# Patient Record
Sex: Female | Born: 1990 | Race: White | Hispanic: No | Marital: Single | Smoking: Current every day smoker
Health system: Southern US, Community
[De-identification: ages and names within clinical notes are randomized; demographics above are authoritative.]

## PROBLEM LIST (undated history)

## (undated) DIAGNOSIS — M549 Dorsalgia, unspecified: Secondary | ICD-10-CM

## (undated) HISTORY — DX: Dorsalgia, unspecified: M54.9

---

## 2017-01-12 ENCOUNTER — Encounter (HOSPITAL_COMMUNITY): Payer: Self-pay | Admitting: Emergency Medicine

## 2017-01-12 ENCOUNTER — Emergency Department (HOSPITAL_COMMUNITY)
Admission: EM | Admit: 2017-01-12 | Discharge: 2017-01-13 | Disposition: A | Discharge status: Home / Self Care | Payer: Self-pay | Attending: Emergency Medicine | Admitting: Emergency Medicine

## 2017-01-12 DIAGNOSIS — G8929 Other chronic pain: Secondary | ICD-10-CM

## 2017-01-12 DIAGNOSIS — M545 Low back pain: Secondary | ICD-10-CM

## 2017-01-12 DIAGNOSIS — M5431 Sciatica, right side: Secondary | ICD-10-CM | POA: Insufficient documentation

## 2017-01-12 DIAGNOSIS — M5432 Sciatica, left side: Secondary | ICD-10-CM | POA: Insufficient documentation

## 2017-01-12 DIAGNOSIS — R42 Dizziness and giddiness: Secondary | ICD-10-CM | POA: Insufficient documentation

## 2017-01-12 DIAGNOSIS — F17200 Nicotine dependence, unspecified, uncomplicated: Secondary | ICD-10-CM | POA: Insufficient documentation

## 2017-01-12 DIAGNOSIS — Z79899 Other long term (current) drug therapy: Secondary | ICD-10-CM | POA: Insufficient documentation

## 2017-01-12 LAB — CBC
HEMATOCRIT: 32.1 % — AB (ref 36.0–46.0)
Hemoglobin: 10.4 g/dL — ABNORMAL LOW (ref 12.0–15.0)
MCH: 23.4 pg — AB (ref 26.0–34.0)
MCHC: 32.4 g/dL (ref 30.0–36.0)
MCV: 72.1 fL — AB (ref 78.0–100.0)
PLATELETS: 277 10*3/uL (ref 150–400)
RBC: 4.45 MIL/uL (ref 3.87–5.11)
RDW: 16.6 % — ABNORMAL HIGH (ref 11.5–15.5)
WBC: 9.1 10*3/uL (ref 4.0–10.5)

## 2017-01-12 LAB — BASIC METABOLIC PANEL
Anion gap: 9 (ref 5–15)
BUN: 8 mg/dL (ref 6–20)
CHLORIDE: 109 mmol/L (ref 101–111)
CO2: 24 mmol/L (ref 22–32)
CREATININE: 0.59 mg/dL (ref 0.44–1.00)
Calcium: 9.4 mg/dL (ref 8.9–10.3)
GFR calc non Af Amer: >60 mL/min (ref >60)
Glucose, Bld: 127 mg/dL — ABNORMAL HIGH (ref 65–99)
POTASSIUM: 3.4 mmol/L — AB (ref 3.5–5.1)
SODIUM: 142 mmol/L (ref 135–145)

## 2017-01-12 LAB — URINALYSIS, ROUTINE W REFLEX MICROSCOPIC
BILIRUBIN URINE: NEGATIVE
Glucose, UA: NEGATIVE mg/dL
HGB URINE DIPSTICK: NEGATIVE
KETONES UR: NEGATIVE mg/dL
LEUKOCYTES UA: NEGATIVE
Nitrite: NEGATIVE
Protein, ur: NEGATIVE mg/dL
SPECIFIC GRAVITY, URINE: 1.018 (ref 1.005–1.030)
WBC UA: NONE SEEN WBC/hpf (ref 0–5)
pH: 7 (ref 5.0–8.0)

## 2017-01-12 LAB — I-STAT BETA HCG BLOOD, ED (MC, WL, AP ONLY): HCG, QUANTITATIVE: 20.7 m[IU]/mL — AB (ref <5)

## 2017-01-12 LAB — LIPASE, BLOOD: Lipase: 38 U/L (ref 11–51)

## 2017-01-12 NOTE — ED Notes (Addendum)
Pt presents to ED for assessment of 2 days of light-headedness, dizziness, "feeling hot from the inside/out", anxiety.  Denies CP, denies SOB.  Pt fidgety in chair at triage.  Pt has a hx of anxiety and depression and had several medication bottles with her that were empty.  Pt states she is new to the area and has not been able to get them filled x 1 month.  (Bottles noted:  Fluoxetine, buproprion, buspirone, gabapentin, hydroxizine).  Pt c/o lower back pain, chronic in nature with rupture discs, but acute after walking to work the past two days.  Pt also c/o right lower quadrant pain yesterday, followed by two bloody bowel movements.  None since.

## 2017-01-13 LAB — RAPID URINE DRUG SCREEN, HOSP PERFORMED
AMPHETAMINES: NOT DETECTED
BENZODIAZEPINES: POSITIVE — AB
Barbiturates: NOT DETECTED
COCAINE: NOT DETECTED
OPIATES: NOT DETECTED
TETRAHYDROCANNABINOL: POSITIVE — AB

## 2017-01-13 LAB — PREGNANCY, URINE: Preg Test, Ur: NEGATIVE

## 2017-01-13 MED ORDER — GABAPENTIN 300 MG PO CAPS: 300.0000 mg | ORAL_CAPSULE | Freq: Two times a day (BID) | ORAL | 0 refills | Status: DC

## 2017-01-13 MED ORDER — HYDROCODONE-ACETAMINOPHEN 5-325 MG PO TABS
2.0000 | ORAL_TABLET | Freq: Once | ORAL | Status: AC
  Administered 2017-01-13: 2 via ORAL
  Filled 2017-01-13: qty 2

## 2017-01-13 NOTE — Discharge Instructions (Signed)
Please follow-up with the orthopedic/back specialist.  Please contact the wellness clinic for an appointment.    Return for fever, loss of consciousness, chest pain, shortness of breath, or any other worsening symptoms.
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
## 2017-01-13 NOTE — ED Provider Notes (Signed)
[facility]-EMERGENCY DEPT Provider Note   CSN: [id number] Arrival date & time: 01/12/17  2033     History   Chief Complaint Chief Complaint  Patient presents with  . Dizziness    HPI Alicia Stanley is a 26 y.o. female.  Patient presents to the ED with a chief complaint of dizziness.  She states that she has had low back pain and dizziness for the past 2 days.  She states that she notices the symptoms most when she is walking up hills toward her place of work.  She states that she becomes dizzy and feels like her heart is racing and that she might pass out.  She denies any chest pain, SOB, or abdominal pain.  She reports some associated weakness in her legs, and states that she feels like they are jello and are going to give out on her.  She denies any bowel or bladder incontinence.  She denies any saddle anesthesia.  Denies any IV drug use.  Denies any intercourse for the past several months.   The history is provided by the patient. No language interpreter was used.    History reviewed. No pertinent past medical history.  There are no active problems to display for this patient.   History reviewed. No pertinent surgical history.  OB History    No data available       Home Medications    Prior to Admission medications   Medication Sig Start Date End Date Taking? Authorizing Provider  acetaminophen (TYLENOL) 500 MG tablet Take 1,500 mg by mouth every 6 (six) hours as needed for mild pain.   Yes [provider]  aspirin-acetaminophen-caffeine (EXCEDRIN MIGRAINE) [phone] [id number] MG tablet Take 1-2 tablets by mouth every 6 (six) hours as needed for headache.   Yes [provider]  B Complex-C (B-COMPLEX WITH VITAMIN C) tablet Take 1 tablet by mouth daily.   Yes [provider]  ibuprofen (ADVIL,MOTRIN) 100 MG/5ML suspension Take 200-800 mg by mouth every 4 (four) hours as needed for mild pain.   Yes [provider]    Family History History reviewed.  No pertinent family history.  Social History Social History  Substance Use Topics  . Smoking status: Current Every Day Smoker    Packs/day: 0.50  . Smokeless tobacco: Never Used  . Alcohol use No     Allergies   Morphine and related   Review of Systems Review of Systems  All other systems reviewed and are negative.    Physical Exam Updated Vital Signs BP (!) 150/106   Pulse 96   Temp 99 F (37.2 C) (Oral)   Resp 15   Ht 5\' 3"  (1.6 m)   Wt 52.2 kg (115 lb)   LMP 12/29/2016   SpO2 100%   BMI 20.37 kg/m   Physical Exam  Constitutional: She is oriented to person, place, and time. She appears well-developed and well-nourished.  HENT:  Head: Normocephalic and atraumatic.  Eyes: Pupils are equal, round, and reactive to light. Conjunctivae and EOM are normal.  Neck: Normal range of motion. Neck supple.  Cardiovascular: Normal rate and regular rhythm.  Exam reveals no gallop and no friction rub.   No murmur heard. Pulmonary/Chest: Effort normal and breath sounds normal. No respiratory distress. She has no wheezes. She has no rales. She exhibits no tenderness.  Abdominal: Soft. Bowel sounds are normal. She exhibits no distension and no mass. There is no tenderness. There is no rebound and no guarding.  Musculoskeletal: Normal range  of motion. She exhibits no edema or tenderness.  Mild paraspinal muscle tenderness  Neurological: She is alert and oriented to person, place, and time.  Normal sensation and strength  Skin: Skin is warm and dry.  No rash  Psychiatric: She has a normal mood and affect. Her behavior is normal. Judgment and thought content normal.  Nursing note and vitals reviewed.    ED Treatments / Results  Labs (all labs ordered are listed, but only abnormal results are displayed) Labs Reviewed  BASIC METABOLIC PANEL - Abnormal; Notable for the following:       Result Value   Potassium 3.4 (*)    Glucose, Bld 127 (*)    All other components within  normal limits  CBC - Abnormal; Notable for the following:    Hemoglobin 10.4 (*)    HCT 32.1 (*)    MCV 72.1 (*)    MCH 23.4 (*)    RDW 16.6 (*)    All other components within normal limits  URINALYSIS, ROUTINE W REFLEX MICROSCOPIC - Abnormal; Notable for the following:    APPearance CLOUDY (*)    Bacteria, UA RARE (*)    Squamous Epithelial / LPF 0-5 (*)    All other components within normal limits  I-STAT BETA HCG BLOOD, ED ([facility], [facility], [facility] ONLY) - Abnormal; Notable for the following:    I-stat hCG, quantitative 20.7 (*)    All other components within normal limits  LIPASE, BLOOD  RAPID URINE DRUG SCREEN, HOSP PERFORMED  PREGNANCY, URINE    EKG  EKG Interpretation None       Radiology No results found.  Procedures Procedures (including critical care time)  Medications Ordered in ED Medications  HYDROcodone-acetaminophen (NORCO/VICODIN) 5-325 MG per tablet 2 tablet (not administered)     Initial Impression / Assessment and Plan / ED Course  I have reviewed the triage vital signs and the nursing notes.  Pertinent labs & imaging results that were available during my care of the patient were reviewed by me and considered in my medical decision making (see chart for details).     Patient with low back pain and dizziness x 2 days.  Noted to be tachycardic and hypertensive.  She denies drug use, but will check UDS as her vitals may be reflective of withdrawal.  She has been in pain management in the past, but is no longer and is new to the area.  Also, her HCG is elevated, but she adamantly denies any sexual intercourse in the past several months.  She has no abdominal pain.  Could be false positive.  Will check urine preg and discussed with Dr. Elesa MassedWard.  Patient is in no apparent distress now.  Will treat pain and reassess.  Patient reassessed.  Feeling improved.  Urine preg is negative.  Denies any sexual contacts.  HCG thought to be false positive.  No headache or fever.  No  evidence of meningitis.  No sign of epidural abscess.  Denies IV drug use.    Discussed with Dr. Elesa MassedWard, who agrees with plan for outpatient follow-up.  Final Clinical Impressions(s) / ED Diagnoses   Final diagnoses:  Chronic bilateral low back pain, with sciatica presence unspecified  Dizziness    New Prescriptions New Prescriptions   GABAPENTIN (NEURONTIN) 300 MG CAPSULE    Take 1 capsule (300 mg total) by mouth 2 (two) times daily.     Roxy HorsemanBrowning, Cadin Luka, PA-C 01/13/17 0259    Ward, Layla MawKristen N, DO 01/13/17 [id number]

## 2017-03-21 ENCOUNTER — Encounter: Payer: Self-pay | Admitting: Emergency Medicine

## 2017-03-21 ENCOUNTER — Emergency Department
Admission: EM | Admit: 2017-03-21 | Discharge: 2017-03-21 | Disposition: A | Discharge status: Home / Self Care | Payer: Medicaid Other | Attending: Emergency Medicine | Admitting: Emergency Medicine

## 2017-03-21 DIAGNOSIS — Z7982 Long term (current) use of aspirin: Secondary | ICD-10-CM | POA: Diagnosis not present

## 2017-03-21 DIAGNOSIS — F17200 Nicotine dependence, unspecified, uncomplicated: Secondary | ICD-10-CM | POA: Diagnosis not present

## 2017-03-21 DIAGNOSIS — L02414 Cutaneous abscess of left upper limb: Secondary | ICD-10-CM | POA: Insufficient documentation

## 2017-03-21 DIAGNOSIS — L0291 Cutaneous abscess, unspecified: Secondary | ICD-10-CM

## 2017-03-21 DIAGNOSIS — R2232 Localized swelling, mass and lump, left upper limb: Secondary | ICD-10-CM | POA: Diagnosis present

## 2017-03-21 DIAGNOSIS — Z79899 Other long term (current) drug therapy: Secondary | ICD-10-CM | POA: Diagnosis not present

## 2017-03-21 MED ORDER — SULFAMETHOXAZOLE-TRIMETHOPRIM 800-160 MG PO TABS: 1.0000 | ORAL_TABLET | Freq: Two times a day (BID) | ORAL | 0 refills | Status: DC

## 2017-03-21 MED ORDER — SULFAMETHOXAZOLE-TRIMETHOPRIM 800-160 MG PO TABS
1.0000 | ORAL_TABLET | Freq: Once | ORAL | Status: AC
  Administered 2017-03-21: 1 via ORAL
  Filled 2017-03-21: qty 1

## 2017-03-21 MED ORDER — TRAMADOL HCL 50 MG PO TABS
50.0000 mg | ORAL_TABLET | Freq: Once | ORAL | Status: AC
  Administered 2017-03-21: 50 mg via ORAL
  Filled 2017-03-21: qty 1

## 2017-03-21 MED ORDER — TRAMADOL HCL 50 MG PO TABS: 50.0000 mg | ORAL_TABLET | Freq: Once | ORAL | Status: DC

## 2017-03-21 MED ORDER — LIDOCAINE-EPINEPHRINE 2 %-1:100000 IJ SOLN
20.0000 mL | Freq: Once | INTRAMUSCULAR | Status: AC
  Administered 2017-03-21: 20 mL via INTRADERMAL
  Filled 2017-03-21: qty 20.4

## 2017-03-21 MED ORDER — CEPHALEXIN 500 MG PO CAPS
500.0000 mg | ORAL_CAPSULE | Freq: Once | ORAL | Status: AC
  Administered 2017-03-21: 500 mg via ORAL
  Filled 2017-03-21: qty 1

## 2017-03-21 MED ORDER — TRAMADOL HCL 50 MG PO TABS: 50.0000 mg | ORAL_TABLET | Freq: Four times a day (QID) | ORAL | 0 refills | Status: DC | PRN

## 2017-03-21 MED ORDER — CEPHALEXIN 500 MG PO CAPS: 500.0000 mg | ORAL_CAPSULE | Freq: Two times a day (BID) | ORAL | 0 refills | Status: DC

## 2017-03-21 NOTE — ED Triage Notes (Signed)
Pt states that for about 5-6 days she has noticed an area that looks to be abscessed to her left forearm, pt states that she has bumped it a couple times and has made it worse, pt has redness and swelling up the left forearm.

## 2017-03-21 NOTE — ED Provider Notes (Signed)
[facility] Emergency Department Provider Note   ____________________________________________    I have reviewed the triage vital signs and the nursing notes.   HISTORY  Chief Complaint Abscess     HPI Grabiela Stanley is a 26 y.o. female Presents with complaints of painful swelling to her left forearm. Patient reports worsening pain to the area, she reports she keeps hitting it on things which she thinks is causing it to swell further. She reports it is red and inflamed. Denies injections or injury to the area. no fevers or chills   No past medical history on file.  There are no active problems to display for this patient.   No past surgical history on file.  Prior to Admission medications   Medication Sig Start Date End Date Taking? Authorizing Provider  acetaminophen (TYLENOL) 500 MG tablet Take 1,500 mg by mouth every 6 (six) hours as needed for mild pain.    [provider]  aspirin-acetaminophen-caffeine (EXCEDRIN MIGRAINE) [phone] MG tablet Take 1-2 tablets by mouth every 6 (six) hours as needed for headache.    [provider]  B Complex-C (B-COMPLEX WITH VITAMIN C) tablet Take 1 tablet by mouth daily.    [provider]  cephALEXin (KEFLEX) 500 MG capsule Take 1 capsule (500 mg total) by mouth 2 (two) times daily. 03/21/17   Jene Every, MD  gabapentin (NEURONTIN) 300 MG capsule Take 1 capsule (300 mg total) by mouth 2 (two) times daily. 01/13/17   Roxy Horseman, PA-C  ibuprofen (ADVIL,MOTRIN) 100 MG/5ML suspension Take 200-800 mg by mouth every 4 (four) hours as needed for mild pain.    [provider]  sulfamethoxazole-trimethoprim (BACTRIM DS,SEPTRA DS) 800-160 MG tablet Take 1 tablet by mouth 2 (two) times daily. 03/21/17   Jene Every, MD  traMADol (ULTRAM) 50 MG tablet Take 1 tablet (50 mg total) by mouth every 6 (six) hours as needed. 03/21/17 03/21/18  Jene Every, MD      Allergies Morphine and related  No family history on file.  Social History Social History  Substance Use Topics  . Smoking status: Current Every Day Smoker    Packs/day: 0.50  . Smokeless tobacco: Never Used  . Alcohol use No    Review of Systems  Constitutional: No fever/chills     Gastrointestinal: No abdominal pain.  No nausea, no vomiting.    Musculoskeletal: arm pain Skin: as above Neurological: Negative for numbnesss     ____________________________________________   PHYSICAL EXAM:  VITAL SIGNS: ED Triage Vitals [03/21/17 2120]  Enc Vitals Group     BP (!) 143/89     Pulse Rate 79     Resp 18     Temp 99.1 F (37.3 C)     Temp Source Oral     SpO2 100 %     Weight 52.2 kg (115 lb)     Height 1.6 m ( )     Head Circumference      Peak Flow      Pain Score 9     Pain Loc      Pain Edu?      Excl. in GC?     Constitutional: Alert and oriented. No acute distress. Pleasant and interactive Eyes: Conjunctivae are normal.   Nose: No congestion/rhinnorhea. Mouth/Throat: Mucous membranes are moist.   Cardiovascular: Normal rate, regular rhythm.  Respiratory: Normal respiratory effort.  No retractions. Genitourinary: deferred Musculoskeletal: no joint swelling Neurologic:  Normal speech and language. No  gross focal neurologic deficits are appreciated.   Skin:  Skin is warm, dry and intact. large likely abscess distal left forearm at the base of the scar. Fluctuant and very tender, erythema, no spreading erythema   ____________________________________________   LABS (all labs ordered are listed, but only abnormal results are displayed)  Labs Reviewed - No data to display ____________________________________________  EKG   ____________________________________________  RADIOLOGY  None ____________________________________________   PROCEDURES  Procedure(s) performed: yes  INCISION AND DRAINAGE Performed by: Jene Every Consent: Verbal consent obtained. Risks and benefits: risks, benefits and alternatives were discussed Type: abscess  Body area: left forearm  Anesthesia: local infiltration  Incision was made with a scalpel.  Local anesthetic: lidocaine 1% w epinephrine  Anesthetic total: 3 ml  Complexity: complex Blunt dissection to break up loculations  Drainage: purulent  Drainage amount: large  Packing material: 1/4 in iodoform gauze  Patient tolerance: Patient tolerated the procedure well with no immediate complications.       Critical Care performed: No ____________________________________________   INITIAL IMPRESSION / ASSESSMENT AND PLAN / ED COURSE  Pertinent labs & imaging results that were available during my care of the patient were reviewed by me and considered in my medical decision making (see chart for details).  patient with large abscess to the left forearm, unclear cause.copious amount of purulent Fluid drained from abscess we'll place the patient on Bactrim and Keflex and have her back in 2 days for repeat check   ____________________________________________   FINAL CLINICAL IMPRESSION(S) / ED DIAGNOSES  Final diagnoses:  Abscess      NEW MEDICATIONS STARTED DURING THIS VISIT:  New Prescriptions   CEPHALEXIN (KEFLEX) 500 MG CAPSULE    Take 1 capsule (500 mg total) by mouth 2 (two) times daily.   SULFAMETHOXAZOLE-TRIMETHOPRIM (BACTRIM DS,SEPTRA DS) 800-160 MG TABLET    Take 1 tablet by mouth 2 (two) times daily.   TRAMADOL (ULTRAM) 50 MG TABLET    Take 1 tablet (50 mg total) by mouth every 6 (six) hours as needed.     Note:  This document was prepared using Dragon voice recognition software and may include unintentional dictation errors.    Jene Every, MD 03/21/17 2237

## 2017-04-14 ENCOUNTER — Emergency Department
Admission: EM | Admit: 2017-04-14 | Discharge: 2017-04-14 | Disposition: A | Discharge status: Home / Self Care | Payer: Medicaid Other | Attending: Emergency Medicine | Admitting: Emergency Medicine

## 2017-04-14 ENCOUNTER — Emergency Department: Payer: Medicaid Other

## 2017-04-14 ENCOUNTER — Encounter: Payer: Self-pay | Admitting: Emergency Medicine

## 2017-04-14 DIAGNOSIS — W51XXXA Accidental striking against or bumped into by another person, initial encounter: Secondary | ICD-10-CM | POA: Insufficient documentation

## 2017-04-14 DIAGNOSIS — Y929 Unspecified place or not applicable: Secondary | ICD-10-CM | POA: Insufficient documentation

## 2017-04-14 DIAGNOSIS — S2232XA Fracture of one rib, left side, initial encounter for closed fracture: Secondary | ICD-10-CM

## 2017-04-14 DIAGNOSIS — S29001A Unspecified injury of muscle and tendon of front wall of thorax, initial encounter: Secondary | ICD-10-CM | POA: Diagnosis present

## 2017-04-14 DIAGNOSIS — Y9383 Activity, rough housing and horseplay: Secondary | ICD-10-CM | POA: Insufficient documentation

## 2017-04-14 DIAGNOSIS — F17200 Nicotine dependence, unspecified, uncomplicated: Secondary | ICD-10-CM | POA: Insufficient documentation

## 2017-04-14 DIAGNOSIS — Y999 Unspecified external cause status: Secondary | ICD-10-CM | POA: Insufficient documentation

## 2017-04-14 LAB — POCT PREGNANCY, URINE: Preg Test, Ur: NEGATIVE

## 2017-04-14 MED ORDER — OXYCODONE-ACETAMINOPHEN 5-325 MG PO TABS: 1.0000 | ORAL_TABLET | Freq: Four times a day (QID) | ORAL | 0 refills | Status: DC | PRN

## 2017-04-14 MED ORDER — OXYCODONE-ACETAMINOPHEN 5-325 MG PO TABS
1.0000 | ORAL_TABLET | ORAL | Status: AC
  Administered 2017-04-14: 1 via ORAL
  Filled 2017-04-14: qty 1

## 2017-04-14 MED ORDER — IBUPROFEN 400 MG PO TABS
400.0000 mg | ORAL_TABLET | ORAL | Status: AC
  Administered 2017-04-14: 400 mg via ORAL
  Filled 2017-04-14: qty 1

## 2017-04-14 MED ORDER — LIDOCAINE 5 % EX PTCH
1.0000 | MEDICATED_PATCH | CUTANEOUS | Status: DC
  Administered 2017-04-14: 1 via TRANSDERMAL
  Filled 2017-04-14: qty 1
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
## 2017-04-26 ENCOUNTER — Encounter (HOSPITAL_COMMUNITY): Payer: Self-pay | Admitting: Emergency Medicine

## 2017-04-26 ENCOUNTER — Ambulatory Visit (HOSPITAL_COMMUNITY)
Admission: EM | Admit: 2017-04-26 | Discharge: 2017-04-26 | Disposition: A | Discharge status: Home / Self Care | Payer: No Typology Code available for payment source | Source: Ambulatory Visit | Attending: Emergency Medicine | Admitting: Emergency Medicine

## 2017-04-26 ENCOUNTER — Emergency Department (HOSPITAL_COMMUNITY)
Admission: EM | Admit: 2017-04-26 | Discharge: 2017-04-26 | Disposition: A | Discharge status: Home / Self Care | Payer: Medicaid Other | Attending: Emergency Medicine | Admitting: Emergency Medicine

## 2017-04-26 ENCOUNTER — Other Ambulatory Visit: Payer: Self-pay

## 2017-04-26 DIAGNOSIS — Z0441 Encounter for examination and observation following alleged adult rape: Secondary | ICD-10-CM | POA: Insufficient documentation

## 2017-04-26 LAB — POC URINE PREG, ED: Preg Test, Ur: NEGATIVE

## 2017-04-26 MED ORDER — PROMETHAZINE HCL 25 MG PO TABS
25.0000 mg | ORAL_TABLET | Freq: Four times a day (QID) | ORAL | Status: DC | PRN
  Administered 2017-04-26: 25 mg via ORAL

## 2017-04-26 MED ORDER — METRONIDAZOLE 500 MG PO TABS
2000.0000 mg | ORAL_TABLET | Freq: Once | ORAL | Status: AC
  Administered 2017-04-26: 2000 mg via ORAL

## 2017-04-26 MED ORDER — ULIPRISTAL ACETATE 30 MG PO TABS
30.0000 mg | ORAL_TABLET | Freq: Once | ORAL | Status: AC
  Administered 2017-04-26: 30 mg via ORAL
  Filled 2017-04-26: qty 1

## 2017-04-26 MED ORDER — CEFTRIAXONE SODIUM 250 MG IJ SOLR
250.0000 mg | Freq: Once | INTRAMUSCULAR | Status: AC
  Administered 2017-04-26: 250 mg via INTRAMUSCULAR

## 2017-04-26 MED ORDER — OXYCODONE-ACETAMINOPHEN 5-325 MG PO TABS: 1.0000 | ORAL_TABLET | ORAL | 0 refills | Status: AC | PRN

## 2017-04-26 MED ORDER — LIDOCAINE HCL (PF) 1 % IJ SOLN
0.9000 mL | Freq: Once | INTRAMUSCULAR | Status: AC
  Administered 2017-04-26: 0.9 mL

## 2017-04-26 MED ORDER — HYDROCODONE-ACETAMINOPHEN 5-325 MG PO TABS
2.0000 | ORAL_TABLET | Freq: Once | ORAL | Status: AC
  Administered 2017-04-26: 2 via ORAL
  Filled 2017-04-26: qty 2

## 2017-04-26 MED ORDER — AZITHROMYCIN 250 MG PO TABS
1000.0000 mg | ORAL_TABLET | Freq: Once | ORAL | Status: AC
  Administered 2017-04-26: 1000 mg via ORAL
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
## 2017-04-26 NOTE — ED Notes (Signed)
Pt left with SANE nurse to exam room, she took urine sample with her for testing

## 2017-04-26 NOTE — ED Notes (Signed)
SANE nurse at bedside.

## 2017-04-26 NOTE — SANE Note (Signed)
Follow-up Phone Call  Patient gives verbal consent for a FNE/SANE follow-up phone call in 48-72 hours: yes Patient's telephone number: [phone] Patient gives verbal consent to leave voicemail at the phone number listed above: yes DO NOT CALL between the hours of: call at 2pm

## 2017-04-26 NOTE — SANE Note (Signed)
Received call from triage, advising of pt.  Advised to call back when pt had been medically cleared - they agree.

## 2017-04-26 NOTE — ED Notes (Signed)
Pt ambulatory to room c/o waiting 3 hrs
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
## 2017-04-26 NOTE — SANE Note (Signed)
Spoke to charge RN in ED to question how long before pt is roomed.  She advises that she will try and pt in a room within an hour.
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
## 2017-04-26 NOTE — ED Notes (Signed)
SANE RN notified of patient's presence.  States to call back when patient is medically cleared
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
## 2017-04-26 NOTE — Discharge Instructions (Signed)
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 for gastrointestinal problems -epinephrine -medicines for allergies or colds -muscle relaxants -narcotic medicines for pain -other medicines that  prolong the QT interval (cause an abnormal heart rhythm) -tramadol -trimethobenzamide This list may not describe all possible interactions. Give your health care provider a list of all the medicines, herbs, non-prescription drugs, or dietary supplements you use. Also tell them if you smoke, drink alcohol, or use illegal drugs. Some items may interact with your medicine. What should I watch for while using this medicine? Tell your doctor or health care professional if your symptoms do not start to get better in 1 to 2 days. You may get drowsy or dizzy. Do not drive, use machinery, or do anything that needs mental alertness until you know how this medicine affects you. To reduce the risk of dizzy or fainting spells,  do not stand or sit up quickly, especially if you are an older patient. Alcohol may increase dizziness and drowsiness. Avoid alcoholic drinks. Your mouth may get dry. Chewing sugarless gum or sucking hard candy, and drinking plenty of water may help. Contact your doctor if the problem does not go away or is severe. This medicine may cause dry eyes and blurred vision. If you wear contact lenses you may feel some discomfort. Lubricating drops may help. See your eye doctor if the problem does not go away or is severe. This medicine can make you more sensitive to the sun. Keep out of the sun. If you cannot avoid being in the sun, wear protective clothing and use sunscreen. Do not use sun lamps or tanning beds/booths. If you are diabetic, check your blood-sugar levels regularly. What side effects may I notice from receiving this medicine? Side effects that you should report to your doctor or health care professional as soon as possible: -blurred vision -irregular heartbeat, palpitations or chest pain -muscle or facial twitches -pain or difficulty passing urine -seizures -skin rash -slowed or shallow breathing -unusual bleeding or bruising -yellowing of the eyes or skin Side effects that usually do not require medical attention (report to your doctor or health care professional if they continue or are bothersome): -headache -nightmares, agitation, nervousness, excitability, not able to sleep (these are more likely in children) -stuffy nose This list may not describe all possible side effects. Call your doctor for medical advice about side effects. You may report side effects to FDA at 1-800-FDA-1088. Where should I keep my medicine? Keep out of the reach of children. Store at room temperature, between 20 and 25 degrees C (68 and 77 degrees F). Protect from light. Throw away any unused medicine after the expiration date. NOTE: This sheet is a summary. It may not cover all possible information. If  you have questions about this medicine, talk to your doctor, pharmacist, or health care provider.  2017 Elsevier/Gold Standard (2013-01-30 15:04:46)   Ceftriaxone (Injection/Shot) Also known as:  Rocephin  Ceftriaxone injection What is this medicine? CEFTRIAXONE (sef try AX one) is a cephalosporin antibiotic. It is used to treat certain kinds of bacterial infections. It will not work for colds, flu, or other viral infections. This medicine may be used for other purposes; ask your health care provider or pharmacist if you have questions. COMMON BRAND NAME(S): Rocephin What should I tell my health care provider before I take this medicine? They need to know if you have any of these conditions: -any chronic illness -bowel disease, like colitis -both kidney and liver disease -high bilirubin level in newborn patients -an unusual or allergic reaction to ceftriaxone, other cephalosporin or penicillin antibiotics, foods, dyes, or preservatives -pregnant or trying to get pregnant -breast-feeding How  should I use this medicine? This medicine is injected into a muscle or infused it into a vein. It is usually given in a medical office or clinic. If you are to give this medicine you will be taught how to inject it. Follow instructions carefully. Use your doses at regular intervals. Do not take your medicine more often than directed. Do not skip doses or stop your medicine early even if you feel better. Do not stop taking except on your doctor's advice. Talk to your pediatrician regarding the use of this medicine in children. Special care may be needed. Overdosage: If you think you have taken too much of this medicine contact a poison control center or emergency room at once. NOTE: This medicine is only for you. Do not share this medicine with others. What if I miss a dose? If you miss a dose, take it as soon as you can. If it is almost time for your next dose, take only that dose. Do not take double  or extra doses. What may interact with this medicine? Do not take this medicine with any of the following medications: -intravenous calcium This medicine may also interact with the following medications: -birth control pills This list may not describe all possible interactions. Give your health care provider a list of all the medicines, herbs, non-prescription drugs, or dietary supplements you use. Also tell them if you smoke, drink alcohol, or use illegal drugs. Some items may interact with your medicine. What should I watch for while using this medicine? Tell your doctor or health care professional if your symptoms do not improve or if they get worse. Do not treat diarrhea with over the counter products. Contact your doctor if you have diarrhea that lasts more than 2 days or if it is severe and watery. If you are being treated for a sexually transmitted disease, avoid sexual contact until you have finished your treatment. Having sex can infect your sexual partner. Calcium may bind to this medicine and cause lung or kidney problems. Avoid calcium products while taking this medicine and for 48 hours after taking the last dose of this medicine. What side effects may I notice from receiving this medicine? Side effects that you should report to your doctor or health care professional as soon as possible: -allergic reactions like skin rash, itching or hives, swelling of the face, lips, or tongue -breathing problems -fever, chills -irregular heartbeat -pain when passing urine -seizures -stomach pain, cramps -unusual bleeding, bruising -unusually weak or tired Side effects that usually do not require medical attention (report to your doctor or health care professional if they continue or are bothersome): -diarrhea -dizzy, drowsy -headache -nausea, vomiting -pain, swelling, irritation where injected -stomach upset -sweating This list may not describe all possible side effects. Call your  doctor for medical advice about side effects. You may report side effects to FDA at 1-800-FDA-1088. Where should I keep my medicine? Keep out of the reach of children. Store at room temperature below 25 degrees C (77 degrees F). Protect from light. Throw away any unused vials after the expiration date. NOTE: This sheet is a summary. It may not cover all possible information. If you have questions about this medicine, talk to your doctor, pharmacist, or health care provider.  2017 Elsevier/Gold Standard (2013-12-17 09:14:54)

## 2017-04-27 LAB — POC URINE PREG, ED: Preg Test, Ur: NEGATIVE

## 2017-05-11 ENCOUNTER — Encounter: Payer: Self-pay | Admitting: Nurse Practitioner
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
## 2018-12-20 DIAGNOSIS — Z720 Tobacco use: Secondary | ICD-10-CM

## 2018-12-20 DIAGNOSIS — E876 Hypokalemia: Secondary | ICD-10-CM

## 2018-12-20 DIAGNOSIS — F3181 Bipolar II disorder: Secondary | ICD-10-CM

## 2018-12-20 DIAGNOSIS — E871 Hypo-osmolality and hyponatremia: Secondary | ICD-10-CM

## 2018-12-20 DIAGNOSIS — N2881 Hypertrophy of kidney: Secondary | ICD-10-CM

## 2018-12-20 DIAGNOSIS — N10 Acute tubulo-interstitial nephritis: Secondary | ICD-10-CM

## 2018-12-20 DIAGNOSIS — A419 Sepsis, unspecified organism: Secondary | ICD-10-CM

## 2018-12-20 DIAGNOSIS — R740 Nonspecific elevation of levels of transaminase and lactic acid dehydrogenase [LDH]: Secondary | ICD-10-CM

## 2018-12-20 DIAGNOSIS — D62 Acute posthemorrhagic anemia: Secondary | ICD-10-CM

## 2018-12-20 DIAGNOSIS — K828 Other specified diseases of gallbladder: Secondary | ICD-10-CM

## 2018-12-20 DIAGNOSIS — R161 Splenomegaly, not elsewhere classified: Secondary | ICD-10-CM

## 2022-04-13 ENCOUNTER — Emergency Department (HOSPITAL_COMMUNITY)
Admission: EM | Admit: 2022-04-13 | Discharge: 2022-04-14 | Disposition: A | Discharge status: Home / Self Care | Payer: Commercial Managed Care - HMO | Attending: Emergency Medicine | Admitting: Emergency Medicine

## 2022-04-13 ENCOUNTER — Other Ambulatory Visit: Payer: Self-pay

## 2022-04-13 ENCOUNTER — Encounter (HOSPITAL_COMMUNITY): Payer: Self-pay

## 2022-04-13 DIAGNOSIS — Y900 Blood alcohol level of less than 20 mg/100 ml: Secondary | ICD-10-CM | POA: Insufficient documentation

## 2022-04-13 DIAGNOSIS — F17200 Nicotine dependence, unspecified, uncomplicated: Secondary | ICD-10-CM | POA: Diagnosis not present

## 2022-04-13 DIAGNOSIS — D72821 Monocytosis (symptomatic): Secondary | ICD-10-CM | POA: Diagnosis not present

## 2022-04-13 DIAGNOSIS — R0602 Shortness of breath: Secondary | ICD-10-CM | POA: Diagnosis not present

## 2022-04-13 DIAGNOSIS — R059 Cough, unspecified: Secondary | ICD-10-CM | POA: Diagnosis not present

## 2022-04-13 DIAGNOSIS — F1510 Other stimulant abuse, uncomplicated: Secondary | ICD-10-CM | POA: Insufficient documentation

## 2022-04-13 DIAGNOSIS — R079 Chest pain, unspecified: Secondary | ICD-10-CM | POA: Insufficient documentation

## 2022-04-13 DIAGNOSIS — M549 Dorsalgia, unspecified: Secondary | ICD-10-CM | POA: Insufficient documentation

## 2022-04-13 HISTORY — DX: Dorsalgia, unspecified: M54.9

## 2022-04-13 NOTE — ED Triage Notes (Addendum)
Pt BIB EMS with reports of back pain/spasms. (Left upper, middle back). Pt smoked weed earlier. Pt is currently sleeping.

## 2022-04-13 NOTE — ED Provider Triage Note (Signed)
Emergency Medicine Provider Triage Evaluation Note  Brianny Soulliere , a 31 y.o. female  was evaluated in triage.  Pt complains of drug abuse. Endorses smoking methamphetamine earlier tonight, then becoming jittery and short of breath. Symptoms have primarily resolved. History of chronic back pain.  Review of Systems  Positive: Back pain, drug use Negative: Lower extremity weakness, paresthesias, urinary or fecal incontinence  Physical Exam  BP (!) 135/109   Pulse 87   Temp 97.9 F (36.6 C) (Oral)   Resp 16   SpO2 100%  Gen:   Asleep, easily arousable Resp:  Normal effort  MSK:   Moves extremities without difficulty  Other:    Medical Decision Making  Medically screening exam initiated at 11:24 PM.  Appropriate orders placed.  Doyne Ellinger was informed that the remainder of the evaluation will be completed by another provider, this initial triage assessment does not replace that evaluation, and the importance of remaining in the ED until their evaluation is complete.     Etta Quill, NP 04/13/22 [id number]

## 2022-04-14 ENCOUNTER — Emergency Department (HOSPITAL_COMMUNITY): Payer: Commercial Managed Care - HMO

## 2022-04-14 LAB — CBC WITH DIFFERENTIAL/PLATELET
Abs Immature Granulocytes: 0.05 10*3/uL (ref 0.00–0.07)
Basophils Absolute: 0.1 10*3/uL (ref 0.0–0.1)
Basophils Relative: 1 %
Eosinophils Absolute: 0.2 10*3/uL (ref 0.0–0.5)
Eosinophils Relative: 2 %
HCT: 38.3 % (ref 36.0–46.0)
Hemoglobin: 13 g/dL (ref 12.0–15.0)
Immature Granulocytes: 1 %
Lymphocytes Relative: 22 %
Lymphs Abs: 2.4 10*3/uL (ref 0.7–4.0)
MCH: 28.8 pg (ref 26.0–34.0)
MCHC: 33.9 g/dL (ref 30.0–36.0)
MCV: 84.9 fL (ref 80.0–100.0)
Monocytes Absolute: 0.7 10*3/uL (ref 0.1–1.0)
Monocytes Relative: 7 %
Neutro Abs: 7.7 10*3/uL (ref 1.7–7.7)
Neutrophils Relative %: 67 %
Platelets: 257 10*3/uL (ref 150–400)
RBC: 4.51 MIL/uL (ref 3.87–5.11)
RDW: 11.9 % (ref 11.5–15.5)
WBC: 11.1 10*3/uL — ABNORMAL HIGH (ref 4.0–10.5)
nRBC: 0 % (ref 0.0–0.2)

## 2022-04-14 LAB — COMPREHENSIVE METABOLIC PANEL
ALT: 19 U/L (ref 0–44)
AST: 16 U/L (ref 15–41)
Albumin: 4.3 g/dL (ref 3.5–5.0)
Alkaline Phosphatase: 55 U/L (ref 38–126)
Anion gap: 7 (ref 5–15)
BUN: 17 mg/dL (ref 6–20)
CO2: 25 mmol/L (ref 22–32)
Calcium: 9.1 mg/dL (ref 8.9–10.3)
Chloride: 106 mmol/L (ref 98–111)
Creatinine, Ser: 0.71 mg/dL (ref 0.44–1.00)
GFR, Estimated: >60 mL/min (ref >60)
Glucose, Bld: 104 mg/dL — ABNORMAL HIGH (ref 70–99)
Potassium: 3.6 mmol/L (ref 3.5–5.1)
Sodium: 138 mmol/L (ref 135–145)
Total Bilirubin: 0.8 mg/dL (ref 0.3–1.2)
Total Protein: 6.9 g/dL (ref 6.5–8.1)

## 2022-04-14 LAB — RAPID URINE DRUG SCREEN, HOSP PERFORMED
Amphetamines: POSITIVE — AB
Barbiturates: NOT DETECTED
Benzodiazepines: NOT DETECTED
Cocaine: NOT DETECTED
Opiates: NOT DETECTED
Tetrahydrocannabinol: NOT DETECTED

## 2022-04-14 LAB — I-STAT BETA HCG BLOOD, ED (MC, WL, AP ONLY): I-stat hCG, quantitative: <5 m[IU]/mL (ref <5)

## 2022-04-14 LAB — TROPONIN I (HIGH SENSITIVITY)
Troponin I (High Sensitivity): 3 ng/L (ref <18)
Troponin I (High Sensitivity): <2 ng/L (ref <18)

## 2022-04-14 LAB — SALICYLATE LEVEL: Salicylate Lvl: <7 mg/dL — ABNORMAL LOW (ref 7.0–30.0)

## 2022-04-14 LAB — ACETAMINOPHEN LEVEL: Acetaminophen (Tylenol), Serum: <10 ug/mL — ABNORMAL LOW (ref 10–30)

## 2022-04-14 LAB — CBG MONITORING, ED: Glucose-Capillary: 84 mg/dL (ref 70–99)

## 2022-04-14 LAB — ETHANOL: Alcohol, Ethyl (B): <10 mg/dL (ref <10)

## 2022-04-14 NOTE — ED Provider Notes (Signed)
[facility] DEPT Provider Note   CSN: [id number] Arrival date & time: 04/13/22  2312     History  Chief Complaint  Patient presents with   Shortness of Breath    Alicia Stanley is a 31 y.o. female with a hx of tobacco use who presents to the ED with complaints of shortness of breath that began shortly prior to arrival which is now fairly resolved.  Patient reports that she smoked some meth and subsequently became short of breath.  At this point symptoms are fairly resolved.  She has had some mild chest pain for the past few days as well as dry cough, no chest pain at present.  Mentioned some chronic back pain to triage, no acute change.  Denies fever, chills, nausea, vomiting, leg pain/swelling, or hemoptysis. Denies SI/HI.   HPI     Home Medications Prior to Admission medications   Medication Sig Start Date End Date Taking? Authorizing Provider  oxyCODONE-acetaminophen (PERCOCET/ROXICET) 5-325 MG tablet Take 1-2 tablets every 4 (four) hours as needed by mouth for severe pain. 04/26/17   Fransico Meadow, PA-C      Allergies    Morphine and related    Review of Systems   Review of Systems  Constitutional:  Negative for chills and fever.  Respiratory:  Positive for cough and shortness of breath.   Cardiovascular:  Positive for chest pain. Negative for leg swelling.  Gastrointestinal:  Negative for abdominal pain and vomiting.  Musculoskeletal:  Positive for back pain (Chronic unchanged).  Neurological:  Negative for weakness and numbness.       Negative for incontinence or saddle anesthesia.  All other systems reviewed and are negative.   Physical Exam Updated Vital Signs BP (!) 135/109   Pulse 87   Temp 97.9 F (36.6 C) (Oral)   Resp 16   SpO2 100%  Physical Exam Vitals and nursing note reviewed.  Constitutional:      General: She is not in acute distress.    Appearance: She is well-developed. She is not toxic-appearing.  HENT:      Head: Normocephalic and atraumatic.  Eyes:     General:        Right eye: No discharge.        Left eye: No discharge.     Conjunctiva/sclera: Conjunctivae normal.  Cardiovascular:     Rate and Rhythm: Normal rate and regular rhythm.     Comments: 2+ symmetric radial pulses. Pulmonary:     Effort: No respiratory distress.     Breath sounds: Normal breath sounds. No wheezing, rhonchi or rales.  Abdominal:     General: There is no distension.     Palpations: Abdomen is soft.     Tenderness: There is no abdominal tenderness. There is no guarding or rebound.  Musculoskeletal:     Cervical back: Neck supple.     Comments: Back: No point/focal vertebral tenderness.  Skin:    General: Skin is warm and dry.  Neurological:     Mental Status: She is alert.     Comments: Sensation and strength grossly tact bilateral lower extremities.  Patient is ambulatory.  Clear speech.   Psychiatric:        Behavior: Behavior normal.     ED Results / Procedures / Treatments   Labs (all labs ordered are listed, but only abnormal results are displayed) Labs Reviewed  COMPREHENSIVE METABOLIC PANEL - Abnormal; Notable for the following components:      Result  Value   Glucose, Bld 104 (*)    All other components within normal limits  SALICYLATE LEVEL - Abnormal; Notable for the following components:   Salicylate Lvl <7.0 (*)    All other components within normal limits  ACETAMINOPHEN LEVEL - Abnormal; Notable for the following components:   Acetaminophen (Tylenol), Serum <10 (*)    All other components within normal limits  RAPID URINE DRUG SCREEN, HOSP PERFORMED - Abnormal; Notable for the following components:   Amphetamines POSITIVE (*)    All other components within normal limits  CBC WITH DIFFERENTIAL/PLATELET - Abnormal; Notable for the following components:   WBC 11.1 (*)    All other components within normal limits  ETHANOL  CBG MONITORING, ED  I-STAT BETA HCG BLOOD, ED ([facility], [facility], [facility]  ONLY)  TROPONIN I (HIGH SENSITIVITY)    EKG None  Radiology DG Chest 2 View  Result Date: 04/14/2022 CLINICAL DATA:  Dyspnea, back pain EXAM: CHEST - 2 VIEW COMPARISON:  None Available. FINDINGS: The heart size and mediastinal contours are within normal limits. Both lungs are clear. The visualized skeletal structures are unremarkable. IMPRESSION: No active cardiopulmonary disease. Electronically Signed   By: Helyn Numbers M.D.   On: 04/14/2022 02:05    Procedures Procedures    Medications Ordered in ED Medications - No data to display  ED Course/ Medical Decision Making/ A&P                           Medical Decision Making Amount and/or Complexity of Data Reviewed Radiology: ordered.  Patient presents to the ED with complaints of dyspnea, this involves an extensive number of treatment options, and is a complaint that carries with it a high risk of complications and morbidity. Nontoxic, vitals w/ mildly elevated diastolic BP.   Additional history obtained:  Chart/nursing notes reviewed.   EKG: No STEMI  Lab Tests:  I viewed & interpreted labs including:  CBC: Mildly cytosis felt to be nonspecific CMP: Grossly unremarkable Ethanol/acetaminophen/salicylate levels: Unremarkable Pregnancy test: Negative Troponin: Negative and flat UDS: Positive for amphetamines.  Imaging Studies:  I ordered and viewed the following imaging, agree with radiologist impression:  CXR; No active cardiopulmonary disease  ED Course:  Chest x-ray without pneumonia, pneumothorax, or fluid overload.  EKG without acute ischemia and troponins are flat therefore doubt ACS.  Patient is low risk Wells, doubt PE.  No widened mediastinum on imaging, symmetric pulses, doubt dissection.  No critical anemia or critical electrolyte derangement.  Dyspnea began after smoking meth, this may have triggered symptoms. Chest pain free tonight.  However patient symptoms have resolved and she has been symptom-free  while in the ED.  With reassuring work-up patient feeling improved feel she is reasonable for discharge. I discussed results, treatment plan, need for follow-up, and return precautions with the patient. Provided opportunity for questions, patient confirmed understanding and is in agreement with plan.   Social determinants: Meth use  Portions of this note were generated with Scientist, clinical (histocompatibility and immunogenetics). Dictation errors may occur despite best attempts at proofreading.   Final Clinical Impression(s) / ED Diagnoses Final diagnoses:  Shortness of breath    Rx / DC Orders ED Discharge Orders     None         Desmond Lope 04/14/22 [id number]    Tilden Fossa, MD 04/14/22 2016

## 2022-04-14 NOTE — Discharge Instructions (Signed)
You were seen in the emergency department today for shortness of breath.  Your lab work, chest x-ray, and EKG were overall reassuring.  Please avoid smoking.  Please get plenty of rest today.  Follow-up with primary care within 3 days.  Return to the emergency department for any new or worsening symptoms or any other concerns.

## 2023-03-23 DIAGNOSIS — R9431 Abnormal electrocardiogram [ECG] [EKG]: Secondary | ICD-10-CM | POA: Diagnosis not present

## 2023-03-23 DIAGNOSIS — N390 Urinary tract infection, site not specified: Secondary | ICD-10-CM | POA: Diagnosis not present

## 2023-03-23 DIAGNOSIS — I10 Essential (primary) hypertension: Secondary | ICD-10-CM | POA: Diagnosis not present

## 2023-03-23 DIAGNOSIS — R079 Chest pain, unspecified: Secondary | ICD-10-CM | POA: Diagnosis not present

## 2023-03-23 DIAGNOSIS — I517 Cardiomegaly: Secondary | ICD-10-CM | POA: Diagnosis not present

## 2023-03-23 DIAGNOSIS — R519 Headache, unspecified: Secondary | ICD-10-CM | POA: Diagnosis not present

## 2023-03-23 DIAGNOSIS — R000 Tachycardia, unspecified: Secondary | ICD-10-CM | POA: Diagnosis not present

## 2023-03-23 DIAGNOSIS — S0990XA Unspecified injury of head, initial encounter: Secondary | ICD-10-CM | POA: Diagnosis not present

## 2023-03-23 DIAGNOSIS — R590 Localized enlarged lymph nodes: Secondary | ICD-10-CM | POA: Diagnosis not present

## 2023-04-12 DIAGNOSIS — F319 Bipolar disorder, unspecified: Secondary | ICD-10-CM | POA: Diagnosis not present

## 2023-04-12 DIAGNOSIS — R45851 Suicidal ideations: Secondary | ICD-10-CM | POA: Diagnosis not present

## 2023-04-12 DIAGNOSIS — F3164 Bipolar disorder, current episode mixed, severe, with psychotic features: Secondary | ICD-10-CM | POA: Diagnosis not present

## 2023-04-12 DIAGNOSIS — F329 Major depressive disorder, single episode, unspecified: Secondary | ICD-10-CM | POA: Diagnosis not present

## 2023-04-12 DIAGNOSIS — F29 Unspecified psychosis not due to a substance or known physiological condition: Secondary | ICD-10-CM | POA: Diagnosis not present

## 2023-04-12 DIAGNOSIS — F333 Major depressive disorder, recurrent, severe with psychotic symptoms: Secondary | ICD-10-CM | POA: Diagnosis not present

## 2023-04-12 DIAGNOSIS — F22 Delusional disorders: Secondary | ICD-10-CM | POA: Diagnosis not present

## 2023-04-13 ENCOUNTER — Inpatient Hospital Stay (HOSPITAL_COMMUNITY): Admission: AD | Admit: 2023-04-13 | Payer: 59 | Source: Intra-hospital | Admitting: Psychiatry

## 2023-04-13 DIAGNOSIS — F22 Delusional disorders: Secondary | ICD-10-CM | POA: Diagnosis not present

## 2023-04-13 DIAGNOSIS — R45851 Suicidal ideations: Secondary | ICD-10-CM | POA: Diagnosis not present

## 2023-04-13 DIAGNOSIS — F3164 Bipolar disorder, current episode mixed, severe, with psychotic features: Secondary | ICD-10-CM | POA: Diagnosis not present

## 2023-04-13 DIAGNOSIS — Z5329 Procedure and treatment not carried out because of patient's decision for other reasons: Secondary | ICD-10-CM | POA: Diagnosis not present

## 2023-04-13 DIAGNOSIS — F333 Major depressive disorder, recurrent, severe with psychotic symptoms: Secondary | ICD-10-CM | POA: Diagnosis not present

## 2023-04-13 DIAGNOSIS — F29 Unspecified psychosis not due to a substance or known physiological condition: Secondary | ICD-10-CM | POA: Diagnosis not present

## 2023-04-13 DIAGNOSIS — F319 Bipolar disorder, unspecified: Secondary | ICD-10-CM | POA: Diagnosis not present

## 2023-04-13 DIAGNOSIS — F329 Major depressive disorder, single episode, unspecified: Secondary | ICD-10-CM | POA: Diagnosis not present

## 2023-04-15 DIAGNOSIS — F329 Major depressive disorder, single episode, unspecified: Secondary | ICD-10-CM | POA: Diagnosis not present

## 2023-04-15 DIAGNOSIS — F319 Bipolar disorder, unspecified: Secondary | ICD-10-CM | POA: Diagnosis not present

## 2023-04-15 DIAGNOSIS — R45851 Suicidal ideations: Secondary | ICD-10-CM | POA: Diagnosis not present

## 2023-04-16 DIAGNOSIS — F319 Bipolar disorder, unspecified: Secondary | ICD-10-CM | POA: Diagnosis not present

## 2023-04-16 DIAGNOSIS — F329 Major depressive disorder, single episode, unspecified: Secondary | ICD-10-CM | POA: Diagnosis not present

## 2023-04-16 DIAGNOSIS — R45851 Suicidal ideations: Secondary | ICD-10-CM | POA: Diagnosis not present

## 2023-05-06 DIAGNOSIS — Z3202 Encounter for pregnancy test, result negative: Secondary | ICD-10-CM | POA: Diagnosis not present

## 2023-06-14 DIAGNOSIS — M545 Low back pain, unspecified: Secondary | ICD-10-CM | POA: Diagnosis not present

## 2024-01-13 DEATH — deceased
